# Patient Record
Sex: Male | Born: 1990 | Race: White | Hispanic: No | Marital: Single | Smoking: Current every day smoker
Health system: Southern US, Community
[De-identification: ages and names within clinical notes are randomized; demographics above are authoritative.]

---
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
## 2013-08-28 ENCOUNTER — Encounter: Payer: Self-pay | Admitting: Physician Assistant

## 2013-08-28 ENCOUNTER — Ambulatory Visit (INDEPENDENT_AMBULATORY_CARE_PROVIDER_SITE_OTHER): Payer: PRIVATE HEALTH INSURANCE | Admitting: Physician Assistant

## 2013-08-28 VITALS — BP 138/84 | HR 100 | Temp 98.3°F | Resp 18 | Ht 69.5 in | Wt 202.0 lb

## 2013-08-28 DIAGNOSIS — A499 Bacterial infection, unspecified: Secondary | ICD-10-CM

## 2013-08-28 DIAGNOSIS — B9689 Other specified bacterial agents as the cause of diseases classified elsewhere: Principal | ICD-10-CM

## 2013-08-28 DIAGNOSIS — J988 Other specified respiratory disorders: Secondary | ICD-10-CM

## 2013-08-28 MED ORDER — AZITHROMYCIN 250 MG PO TABS: ORAL_TABLET | ORAL | Status: DC

## 2013-08-28 NOTE — Progress Notes (Signed)
    Patient ID: Jason ParodyDarren Badolato MRN: [id number], DOB: Nov 20, 1990, 23 y.o. Date of Encounter: 08/28/2013, 2:52 PM    Chief Complaint:  Chief Complaint  Patient presents with  . c/o sore throat, left side facial pain     HPI: 23 y.o. year old white male reports that he has had these symptoms for about 5 days now.says that he is blowing out a lot of thick mucus from his nose. Points to  his left maxillary sinus and his left lymph node below his left ear and says that this area is  tender and sore are compared to the right side. Also does have congestion in his chest and does have a cough. No fevers or chills. No significant sore throat- just scratchy.     Home Meds: See attached medication section for any medications that were entered at today's visit. The computer does not put those onto this list.The following list is a list of meds entered prior to today's visit.   No current outpatient prescriptions on file prior to visit.   No current facility-administered medications on file prior to visit.    Allergies:  Allergies  Allergen Reactions  . Penicillins Swelling      Review of Systems: See HPI for pertinent ROS. All other ROS negative.    Physical Exam: Blood pressure 138/84, pulse 100, temperature 98.3 F (36.8 C), temperature source Oral, resp. rate 18, height 5' 9.5" (1.765 m), weight 202 lb (91.627 kg)., Body mass index is 29.41 kg/(m^2). General:  WNWD WM. Appears in no acute distress. HEENT: Normocephalic, atraumatic, eyes without discharge, sclera non-icteric, nares are without discharge. Bilateral auditory canals clear, TM's are without perforation, pearly grey and translucent with reflective cone of light bilaterally. Oral cavity moist, posterior pharynx without exudate, erythema, peritonsillar abscess. Mild Tenderness with percussion of the left maxillary sinus. No tenderness with percussion of the right maxillary or frontal sinuses.  Neck: Supple. No thyromegaly.Left  posterior cervical nodes/postauricular nodes tender but not enlarged. Right neck with no lymphadenopathy. Lungs: Clear bilaterally to auscultation without wheezes, rales, or rhonchi. Breathing is unlabored. Heart: Regular rhythm. No murmurs, rubs, or gallops. Msk:  Strength and tone normal for age. Extremities/Skin: Warm and dry.  No rashes . Neuro: Alert and oriented X 3. Moves all extremities spontaneously. Gait is normal. CNII-XII grossly in tact. Psych:  Responds to questions appropriately with a normal affect.     ASSESSMENT AND PLAN:  23 y.o. year old male with  1. Bacterial respiratory infection PCN Allergy.  Complete  the antibiotic. Can also use over-the-counter decongestants for symptom relief in the meantime. F/U if symptoms do not resolve within one week after completion of antibiotic. - azithromycin (ZITHROMAX) 250 MG tablet; Day 1: Take 2.  Days 2-5: Take 1 daily.  Dispense: 6 tablet; Refill: 0   Signed, [address]Mary Beth Benning[place]ixon, [place], [facility] 08/28/2013 2:52 PM

## 2017-03-11 ENCOUNTER — Encounter (HOSPITAL_COMMUNITY): Payer: Self-pay

## 2017-03-11 ENCOUNTER — Ambulatory Visit (HOSPITAL_COMMUNITY)
Admission: EM | Admit: 2017-03-11 | Discharge: 2017-03-11 | Disposition: A | Discharge status: Home / Self Care | Payer: Self-pay | Attending: Emergency Medicine | Admitting: Emergency Medicine

## 2017-03-11 DIAGNOSIS — L03116 Cellulitis of left lower limb: Secondary | ICD-10-CM

## 2017-03-11 MED ORDER — IBUPROFEN 600 MG PO TABS: 600.0000 mg | ORAL_TABLET | Freq: Four times a day (QID) | ORAL | 0 refills | Status: DC | PRN

## 2017-03-11 MED ORDER — DOXYCYCLINE HYCLATE 100 MG PO CAPS
100.0000 mg | ORAL_CAPSULE | Freq: Two times a day (BID) | ORAL | 0 refills | Status: AC
Start: 2017-03-11 — End: 2017-03-16

## 2017-03-11 NOTE — ED Provider Notes (Signed)
HPI  SUBJECTIVE:  Jason Bennett is a 26 y.o. male who presents with a painful mass of gradually increasing size on his left upper thigh starting 5 days ago.  He also reports redness. He is not sure if he sustained any trauma to the area, states he may have hit it against wood while trying to clear trees. No drainage, no known insect Bite, no fevers, bodyaches. No contacts with MRSA. Alleviating factors. He has not tried anything for this. Symptoms are worse with palpation. Past medical history negative for HIV, diabetes, hypertension, MRSA. PMD: None.   History reviewed. No pertinent past medical history.  History reviewed. No pertinent surgical history.  History reviewed. No pertinent family history.  Social History  Substance Use Topics  . Smoking status: Current Every Day Smoker    Packs/day: 0.30    Types: Cigarettes  . Smokeless tobacco: Never Used  . Alcohol use No    No current facility-administered medications for this encounter.   Current Outpatient Prescriptions:  .  doxycycline (VIBRAMYCIN) 100 MG capsule, Take 1 capsule (100 mg total) by mouth 2 (two) times daily., Disp: 10 capsule, Rfl: 0 .  ibuprofen (ADVIL,MOTRIN) 600 MG tablet, Take 1 tablet (600 mg total) by mouth every 6 (six) hours as needed., Disp: 30 tablet, Rfl: 0  Allergies  Allergen Reactions  . Penicillins Swelling     ROS  As noted in HPI.   Physical Exam  BP (!) 155/86 (BP Location: Left Arm)   Pulse (!) 105   Temp 98.8 F (37.1 C) (Oral)   Resp 17   SpO2 100%   Constitutional: Well developed, well nourished, no acute distress Eyes:  EOMI, conjunctiva normal bilaterally HENT: Normocephalic, atraumatic,mucus membranes moist Respiratory: Normal inspiratory effort Cardiovascular: Normal rate GI: nondistended skin: 12.5 x 7.5 cm nontender area of blanchable erythema, increased temperature along the left medial upper thigh. Marked this with a solid line for reference. 3 x 3 cm tender round  mobile mass at the superior part of the erythema. No central fluctuance. Marked this with a dotted line for reference. See picture.    Lymph: Positive inguinal lymphadenopathy. Musculoskeletal: no deformities Neurologic: Alert & oriented x 3, no focal neuro deficits Psychiatric: Speech and behavior appropriate   ED Course   Medications - No data to display  No orders of the defined types were placed in this encounter.   No results found for this or any previous visit (from the past 24 hour(s)). No results found.  ED Clinical Impression  Cellulitis of left lower extremity  ED Assessment/Plan  Presentation most consistent with a cellulitis. Feel that the mass is most likely a lymph node. There does not appear to be anything to I&D at this time. Home with warm compresses, doxycycline, ibuprofen 600 mg take 1 g of Tylenol. Providing primary care referral list for him to follow up with as needed. Or may return here. To the ER if he gets worse..  Discussed labs, imaging, MDM, plan and followup with patient. Discussed sn/sx that should prompt return to the ED. Patient  agrees with plan.   Meds ordered this encounter  Medications  . doxycycline (VIBRAMYCIN) 100 MG capsule    Sig: Take 1 capsule (100 mg total) by mouth 2 (two) times daily.    Dispense:  10 capsule    Refill:  0  . ibuprofen (ADVIL,MOTRIN) 600 MG tablet    Sig: Take 1 tablet (600 mg total) by mouth every 6 (six) hours as needed.  Dispense:  30 tablet    Refill:  0    *This clinic note was created using Scientist, clinical (histocompatibility and immunogenetics)Dragon dictation software. Therefore, there may be occasional mistakes despite careful proofreading.  ?   Domenick GongMortenson, Sascha Palma, MD 03/11/17 2054

## 2017-03-11 NOTE — ED Triage Notes (Signed)
[facility] knot on upper thigh since Saturday 03/06/2017, pt denies any injuries but says the knot has got bigger since Saturday.

## 2017-03-11 NOTE — Discharge Instructions (Signed)
Take the medication as written. Take 1 gram of tylenol with the motrin up to 3- 4 times a day as needed for pain and fever. This is an effective combination for pain. Return to the ER if you get worse, have a persistent fever >100.4, for the signs and symptoms we discussed, or for any concerns.   Below is a list of primary care practices who are taking new patients for you to follow-up with. [facility] [address] [place], [place] [address] [phone]  [facility] [phone] [address] [place] [address]  [facility]: [address] [place] [id number]  [phone]  [facility]: [address] [place] [id number]   [phone]  [facility]: [address] [place] [address]  [phone]  [facility] : [address] [place] [id number] [phone]  [facility]: [address] [place] [phone]  [facility]: [address] [place] [id number] [phone]  Dr. Oneal GroutMahima Pandey [address] [facility] [place] [id number]  [phone]  Dr. Jackie PlumGeorge Osei-Bonsu, [facility]. [address] [place], [place] [address]  [phone]  Go to www.goodrx.com to look up your medications. This will give you a list of where you can find your prescriptions at the most affordable prices. Or ask the pharmacist what the cash price is, or if they have any other discount programs available to help make your medication more affordable. This can be less expensive than what you would pay with insurance.

## 2017-08-13 ENCOUNTER — Encounter (HOSPITAL_COMMUNITY): Payer: Self-pay | Admitting: Emergency Medicine

## 2017-08-13 ENCOUNTER — Emergency Department (HOSPITAL_COMMUNITY): Payer: Self-pay

## 2017-08-13 ENCOUNTER — Emergency Department (HOSPITAL_COMMUNITY)
Admission: EM | Admit: 2017-08-13 | Discharge: 2017-08-14 | Disposition: A | Discharge status: Home / Self Care | Payer: Self-pay | Attending: Emergency Medicine | Admitting: Emergency Medicine

## 2017-08-13 DIAGNOSIS — W230XXA Caught, crushed, jammed, or pinched between moving objects, initial encounter: Secondary | ICD-10-CM | POA: Insufficient documentation

## 2017-08-13 DIAGNOSIS — S61215A Laceration without foreign body of left ring finger without damage to nail, initial encounter: Secondary | ICD-10-CM | POA: Insufficient documentation

## 2017-08-13 DIAGNOSIS — Y929 Unspecified place or not applicable: Secondary | ICD-10-CM | POA: Insufficient documentation

## 2017-08-13 DIAGNOSIS — Y939 Activity, unspecified: Secondary | ICD-10-CM | POA: Insufficient documentation

## 2017-08-13 DIAGNOSIS — Z23 Encounter for immunization: Secondary | ICD-10-CM | POA: Insufficient documentation

## 2017-08-13 DIAGNOSIS — F17210 Nicotine dependence, cigarettes, uncomplicated: Secondary | ICD-10-CM | POA: Insufficient documentation

## 2017-08-13 DIAGNOSIS — Y999 Unspecified external cause status: Secondary | ICD-10-CM | POA: Insufficient documentation

## 2017-08-13 DIAGNOSIS — S61217A Laceration without foreign body of left little finger without damage to nail, initial encounter: Secondary | ICD-10-CM | POA: Insufficient documentation

## 2017-08-13 DIAGNOSIS — Z79899 Other long term (current) drug therapy: Secondary | ICD-10-CM | POA: Insufficient documentation

## 2017-08-13 MED ORDER — TETANUS-DIPHTH-ACELL PERTUSSIS 5-2.5-18.5 LF-MCG/0.5 IM SUSP
0.5000 mL | Freq: Once | INTRAMUSCULAR | Status: AC
  Administered 2017-08-13: 0.5 mL via INTRAMUSCULAR
  Filled 2017-08-13: qty 0.5

## 2017-08-13 MED ORDER — BUPIVACAINE HCL (PF) 0.5 % IJ SOLN
10.0000 mL | Freq: Once | INTRAMUSCULAR | Status: AC
  Administered 2017-08-13: 10 mL
  Filled 2017-08-13: qty 10

## 2017-08-13 NOTE — ED Triage Notes (Signed)
ETOH on board 

## 2017-08-13 NOTE — ED Triage Notes (Signed)
Pt reports he slammed his L hand in trunk of car. Pt has laceration to L ring finger and L pinky finger. Unsure of tetanus status.

## 2017-08-13 NOTE — ED Provider Notes (Signed)
[facility] Provider Note   CSN: [id number] Arrival date & time: 08/13/17  2120     History   Chief Complaint Chief Complaint  Patient presents with  . Laceration    HPI Jason Bennett is a 27 y.o. male.  Patient present with injury to left hand, 4th and 5th digits, after closed a car trunk on his hand earlier today. No other injury.   The history is provided by the patient. No language interpreter was used.  Laceration      History reviewed. No pertinent past medical history.  There are no active problems to display for this patient.   History reviewed. No pertinent surgical history.      Home Medications    Prior to Admission medications   Medication Sig Start Date End Date Taking? Authorizing Provider  ibuprofen (ADVIL,MOTRIN) 600 MG tablet Take 1 tablet (600 mg total) by mouth every 6 (six) hours as needed. 03/11/17   Domenick GongMortenson, Ashley, MD    Family History No family history on file.  Social History Social History   Tobacco Use  . Smoking status: Current Every Day Smoker    Packs/day: 0.30    Types: Cigarettes  . Smokeless tobacco: Never Used  Substance Use Topics  . Alcohol use: No  . Drug use: No     Allergies   Penicillins   Review of Systems Review of Systems  Gastrointestinal: Negative for nausea.  Skin: Positive for wound.  Neurological: Negative for numbness.     Physical Exam Updated Vital Signs BP 138/82   Pulse (!) 103   Temp 98.2 F (36.8 C)   Resp (!) 22   SpO2 97%   Physical Exam  Constitutional: He is oriented to person, place, and time. He appears well-developed and well-nourished.  Neck: Normal range of motion.  Pulmonary/Chest: Effort normal.  Musculoskeletal: Normal range of motion.  Neurological: He is alert and oriented to person, place, and time. No sensory deficit.  Skin: Skin is warm and dry.  2 cm laceration palmar 4th left finger overlying PIP. Actively  bleeding. ROM preserved. Crescent shaped laceration lateral base 5th finger, flap laceration. No significant swelling or active bleeding. ROM preserved.   Psychiatric: He has a normal mood and affect.     ED Treatments / Results  Labs (all labs ordered are listed, but only abnormal results are displayed) Labs Reviewed - No data to display  EKG None  Radiology Dg Hand Complete Left  Result Date: 08/13/2017 CLINICAL DATA:  Left hand laceration EXAM: LEFT HAND - COMPLETE 3+ VIEW COMPARISON:  None. FINDINGS: There is no evidence of fracture or dislocation. There is no evidence of arthropathy or other focal bone abnormality. Soft tissues are unremarkable. IMPRESSION: Negative. Electronically Signed   By: Jasmine PangKim  Fujinaga M.D.   On: 08/13/2017 22:00    Procedures .Marland Kitchen.Laceration Repair Date/Time: 08/14/2017 12:11 AM Performed by: Elpidio AnisUpstill, Kenedi Cilia, PA-C Authorized by: Elpidio AnisUpstill, Gerrell Tabet, PA-C   Consent:    Consent obtained:  Verbal   Consent given by:  Patient Anesthesia (see MAR for exact dosages):    Anesthesia method:  Nerve block   Block location:  Palmar 4th MCP   Block needle gauge:  27 G   Block anesthetic:  Bupivacaine 0.5% w/o epi Laceration details:    Location:  Finger   Finger location:  L ring finger   Length (cm):  2 Repair type:    Repair type:  Simple Pre-procedure details:    Preparation:  Patient was prepped and draped in usual sterile fashion Exploration:    Hemostasis achieved with:  Tourniquet   Wound exploration: wound explored through full range of motion and entire depth of wound probed and visualized     Wound extent: no foreign bodies/material noted and no tendon damage noted     Contaminated: no   Treatment:    Area cleansed with:  Betadine and saline   Amount of cleaning:  Standard   Irrigation solution:  Sterile water   Irrigation method:  Pressure wash Skin repair:    Repair method:  Sutures   Suture size:  4-0   Suture material:  Prolene   Suture  technique:  Simple interrupted   Number of sutures:  6 Approximation:    Approximation:  Close Post-procedure details:    Dressing:  Antibiotic ointment and non-adherent dressing   Patient tolerance of procedure:  Tolerated well, no immediate complications .Marland KitchenLaceration Repair Date/Time: 08/14/2017 12:14 AM Performed by: Elpidio Anis, PA-C Authorized by: Elpidio Anis, PA-C   Consent:    Consent obtained:  Verbal   Consent given by:  Patient Anesthesia (see MAR for exact dosages):    Anesthesia method:  Local infiltration   Local anesthetic:  Bupivacaine 0.5% w/o epi Laceration details:    Location:  Finger   Finger location:  L small finger   Length (cm):  1 Repair type:    Repair type:  Simple Pre-procedure details:    Preparation:  Patient was prepped and draped in usual sterile fashion Exploration:    Hemostasis achieved with:  Direct pressure   Wound exploration: wound explored through full range of motion     Contaminated: no   Treatment:    Area cleansed with:  Betadine and saline   Amount of cleaning:  Standard Skin repair:    Repair method:  Sutures   Suture size:  4-0   Suture material:  Prolene   Suture technique:  Simple interrupted   Number of sutures:  3 Approximation:    Approximation:  Close Post-procedure details:    Dressing:  Antibiotic ointment and non-adherent dressing   Patient tolerance of procedure:  Tolerated well, no immediate complications   (including critical care time)  Medications Ordered in ED Medications  Tdap (BOOSTRIX) injection 0.5 mL (has no administration in time range)  bupivacaine (MARCAINE) 0.5 % injection 10 mL (has no administration in time range)     Initial Impression / Assessment and Plan / ED Course  I have reviewed the triage vital signs and the nursing notes.  Pertinent labs & imaging results that were available during my care of the patient were reviewed by me and considered in my medical decision making (see  chart for details).     Patient presents after closing left fingers in truck of car causing multiple lacerations, closed as per procedure notes. Imaging negative for fracture.   Tetanus updated here. Wound care discussed. Sutures out in 7-10 day.   Final Clinical Impressions(s) / ED Diagnoses   Final diagnoses:  None   1. Contusion injury, finger 2. Laceration 4th left finger 3. Laceration 5th left finger.   ED Discharge Orders    None       Elpidio Anis, Cordelia Poche 08/14/17 0016    Charlynne Pander, MD 08/17/17 1044

## 2017-08-14 MED ORDER — IBUPROFEN 600 MG PO TABS: 600.0000 mg | ORAL_TABLET | Freq: Four times a day (QID) | ORAL | 0 refills | Status: DC | PRN

## 2017-08-14 NOTE — ED Notes (Signed)
Pt discharged from ED; instructions provided and scripts given; Pt encouraged to return to ED if symptoms worsen and to f/u with PCP; Pt verbalized understanding of all instructions 

## 2017-10-22 ENCOUNTER — Ambulatory Visit (HOSPITAL_COMMUNITY)
Admission: EM | Admit: 2017-10-22 | Discharge: 2017-10-22 | Disposition: A | Discharge status: Home / Self Care | Payer: Self-pay | Attending: Urgent Care | Admitting: Urgent Care

## 2017-10-22 ENCOUNTER — Encounter (HOSPITAL_COMMUNITY): Payer: Self-pay | Admitting: Emergency Medicine

## 2017-10-22 DIAGNOSIS — L500 Allergic urticaria: Secondary | ICD-10-CM

## 2017-10-22 DIAGNOSIS — R21 Rash and other nonspecific skin eruption: Secondary | ICD-10-CM

## 2017-10-22 MED ORDER — METHYLPREDNISOLONE ACETATE 80 MG/ML IJ SUSP
80.0000 mg | Freq: Once | INTRAMUSCULAR | Status: AC
  Administered 2017-10-22: 80 mg via INTRAMUSCULAR

## 2017-10-22 MED ORDER — HYDROXYZINE HCL 25 MG PO TABS: 25.0000 mg | ORAL_TABLET | Freq: Three times a day (TID) | ORAL | 0 refills | Status: AC | PRN

## 2017-10-22 MED ORDER — METHYLPREDNISOLONE ACETATE 80 MG/ML IJ SUSP
INTRAMUSCULAR | Status: AC
  Filled 2017-10-22: qty 1

## 2017-10-22 NOTE — ED Provider Notes (Signed)
  MRN: [id number] DOB: [birth year]  Subjective:   Jason Bennett is a 27 y.o. male presenting for acute onset ~2 hours ago of urticaria over his arms that has since spread to his torso, back, and thighs. Patient was at [facility] when he started feeling itching. He took 2 doses of benadryl. He is not currently taking any medications.  Denies facial swelling, chest tightness, shob, wheezing, n/v, abdominal pain. Has kept the same routine, no new medications or new foods. Had a tick bite on his back more than 2 weeks ago.   Allergies  Allergen Reactions  . Penicillins Swelling   Denies past medical and surgical history.   Objective:   Vitals: BP (!) 144/85   Pulse 85   Temp 98 F (36.7 C)   Resp 18   SpO2 96%   Physical Exam  Constitutional: He is oriented to person, place, and time. He appears well-developed and well-nourished.  HENT:  Mouth/Throat: Oropharynx is clear and moist.  Eyes: No scleral icterus.  Cardiovascular: Normal rate, regular rhythm and intact distal pulses. Exam reveals no gallop and no friction rub.  No murmur heard. Pulmonary/Chest: No respiratory distress. He has no wheezes. He has no rales.  Neurological: He is alert and oriented to person, place, and time.  Skin: Skin is warm and dry. Rash (large urticarial patches diffusely scattered across his arms, torso, back and thighs) noted.   Assessment and Plan :   Rash and nonspecific skin eruption  Allergic urticaria  IM depomedrol in clinic. Use Vistaril at home. ER and rtc precautions reviewed.   Wallis Bamberg, PA-C 10/22/17 2004

## 2017-10-22 NOTE — ED Triage Notes (Signed)
Pt broke out in rash all over body 1.5 hour PTA. Denies issues breathing.

## 2019-01-09 ENCOUNTER — Other Ambulatory Visit: Payer: Self-pay

## 2019-01-09 ENCOUNTER — Encounter (HOSPITAL_COMMUNITY): Payer: Self-pay

## 2019-01-09 ENCOUNTER — Emergency Department (HOSPITAL_COMMUNITY)
Admission: EM | Admit: 2019-01-09 | Discharge: 2019-01-09 | Disposition: A | Discharge status: Home / Self Care | Payer: Self-pay | Attending: Emergency Medicine | Admitting: Emergency Medicine

## 2019-01-09 DIAGNOSIS — Z79899 Other long term (current) drug therapy: Secondary | ICD-10-CM | POA: Insufficient documentation

## 2019-01-09 DIAGNOSIS — T781XXA Other adverse food reactions, not elsewhere classified, initial encounter: Secondary | ICD-10-CM | POA: Insufficient documentation

## 2019-01-09 DIAGNOSIS — F17210 Nicotine dependence, cigarettes, uncomplicated: Secondary | ICD-10-CM | POA: Insufficient documentation

## 2019-01-09 MED ORDER — PREDNISONE 20 MG PO TABS: 40.0000 mg | ORAL_TABLET | Freq: Every day | ORAL | 0 refills | Status: AC

## 2019-01-09 MED ORDER — FAMOTIDINE IN NACL 20-0.9 MG/50ML-% IV SOLN
20.0000 mg | INTRAVENOUS | Status: AC
  Administered 2019-01-09: 20 mg via INTRAVENOUS
  Filled 2019-01-09: qty 50

## 2019-01-09 MED ORDER — METHYLPREDNISOLONE SODIUM SUCC 125 MG IJ SOLR
125.0000 mg | Freq: Once | INTRAMUSCULAR | Status: AC
  Administered 2019-01-09: 125 mg via INTRAVENOUS
  Filled 2019-01-09: qty 2

## 2019-01-09 MED ORDER — DIPHENHYDRAMINE HCL 50 MG/ML IJ SOLN
25.0000 mg | Freq: Once | INTRAMUSCULAR | Status: AC
  Administered 2019-01-09: 25 mg via INTRAVENOUS
  Filled 2019-01-09: qty 1

## 2019-01-09 NOTE — ED Triage Notes (Signed)
Pt coming from home c/o allergic rxn to possibly red meat. Pt has red raised rash on body. Pt had lyme disease last year with similar symptoms when eating red meat afterwards. Pt took benadryl and "some type of itching pill" at home. No shortness of breath

## 2019-01-09 NOTE — ED Provider Notes (Signed)
[facility] DEPT Provider Note   CSN: [id number] Arrival date & time: 01/09/19  8185     History   Chief Complaint Chief Complaint  Patient presents with  . Allergic Reaction    HPI Jason Bennett is a 28 y.o. male.     Patient presents to the emergency department with a chief complaint of allergic reaction.  He states that he has an allergy to red meat.  States that he ate some tonight.  Caused him to break out in a rash.  He reports having an episode of diarrhea.  Denies any nausea or vomiting.  Denies any shortness of breath, cough, or wheezing.  Denies any throat swelling sensation.  Tried taking some Benadryl at home with little relief.  Has a history of the same.  The history is provided by the patient. No language interpreter was used.    History reviewed. No pertinent past medical history.  There are no active problems to display for this patient.   History reviewed. No pertinent surgical history.      Home Medications    Prior to Admission medications   Medication Sig Start Date End Date Taking? Authorizing Provider  hydrOXYzine (ATARAX/VISTARIL) 25 MG tablet Take 1 tablet (25 mg total) by mouth every 8 (eight) hours as needed for itching. 10/22/17   Jaynee Eagles, PA-C  ibuprofen (ADVIL,MOTRIN) 600 MG tablet Take 1 tablet (600 mg total) by mouth every 6 (six) hours as needed. 08/14/17   Charlann Lange, PA-C    Family History No family history on file.  Social History Social History   Tobacco Use  . Smoking status: Current Every Day Smoker    Packs/day: 0.30    Types: Cigarettes  . Smokeless tobacco: Never Used  Substance Use Topics  . Alcohol use: No  . Drug use: No     Allergies   Penicillins   Review of Systems Review of Systems  All other systems reviewed and are negative.    Physical Exam Updated Vital Signs BP (!) 147/115 (BP Location: Left Arm)   Pulse (!) 126   Temp 98.4 F (36.9 C) (Oral)   Resp  16   Ht 5\' 10"  (1.778 m)   Wt 119.1 kg   SpO2 100%   BMI 37.66 kg/m   Physical Exam Vitals signs and nursing note reviewed.  Constitutional:      Appearance: He is well-developed.  HENT:     Head: Normocephalic and atraumatic.  Eyes:     Conjunctiva/sclera: Conjunctivae normal.  Neck:     Musculoskeletal: Neck supple.  Cardiovascular:     Rate and Rhythm: Normal rate and regular rhythm.     Heart sounds: No murmur.  Pulmonary:     Effort: Pulmonary effort is normal. No respiratory distress.     Breath sounds: Normal breath sounds.  Abdominal:     Palpations: Abdomen is soft.     Tenderness: There is no abdominal tenderness.  Skin:    General: Skin is warm and dry.     Comments: Diffuse urticaria  Neurological:     Mental Status: He is alert and oriented to person, place, and time.  Psychiatric:        Mood and Affect: Mood normal.        Behavior: Behavior normal.      ED Treatments / Results  Labs (all labs ordered are listed, but only abnormal results are displayed) Labs Reviewed - No data to display  EKG None  Radiology No results found.  Procedures Procedures (including critical care time)  Medications Ordered in ED Medications  famotidine (PEPCID) IVPB 20 mg premix (20 mg Intravenous New Bag/Given 01/09/19 0413)  methylPREDNISolone sodium succinate (SOLU-MEDROL) 125 mg/2 mL injection 125 mg (125 mg Intravenous Given 01/09/19 0411)  diphenhydrAMINE (BENADRYL) injection 25 mg (25 mg Intravenous Given 01/09/19 0412)     Initial Impression / Assessment and Plan / ED Course  I have reviewed the triage vital signs and the nursing notes.  Pertinent labs & imaging results that were available during my care of the patient were reviewed by me and considered in my medical decision making (see chart for details).        Patient with allergic reaction to red meat.  He has diffuse urticaria.  No wheezing.  No throat swelling.  Did have one episode of diarrhea.   Will give Solu-Medrol, Pepcid, and Benadryl.  Will reassess.  5:54 AM Patient reassessed.  Feels much better.  Hives significantly improved.  Final Clinical Impressions(s) / ED Diagnoses   Final diagnoses:  Allergic reaction to food, initial encounter    ED Discharge Orders         Ordered    predniSONE (DELTASONE) 20 MG tablet  Daily     01/09/19 0553           Roxy HorsemanBrowning, Arlo Buffone, PA-C 01/09/19 [id number]    Devoria AlbeKnapp, Iva, MD 01/09/19 [phone] [id number]

## 2019-01-16 IMAGING — DX DG HAND COMPLETE 3+V*L*
3 series · 3 of 3 positions shown · non-contrast
Comparison: None.

CLINICAL DATA: Left hand laceration

EXAM:
LEFT HAND - COMPLETE 3+ VIEW

[x hand pa left]
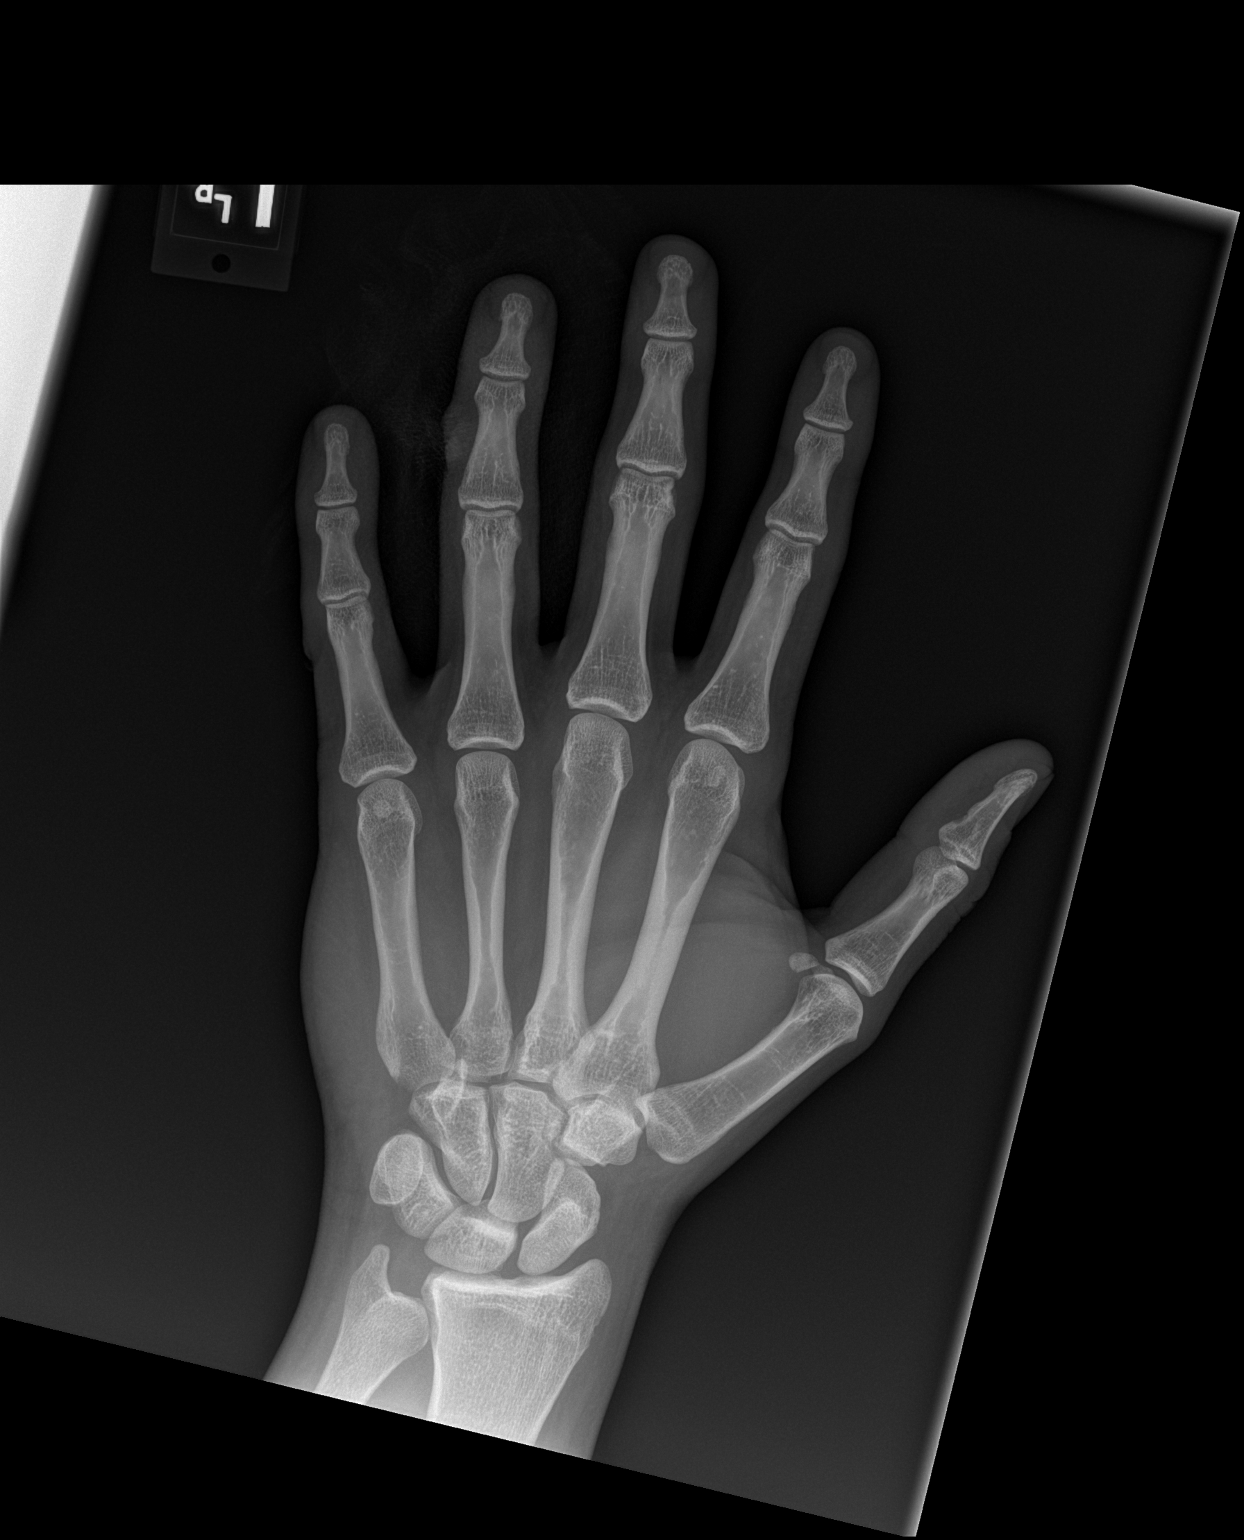

[x hand obl left]
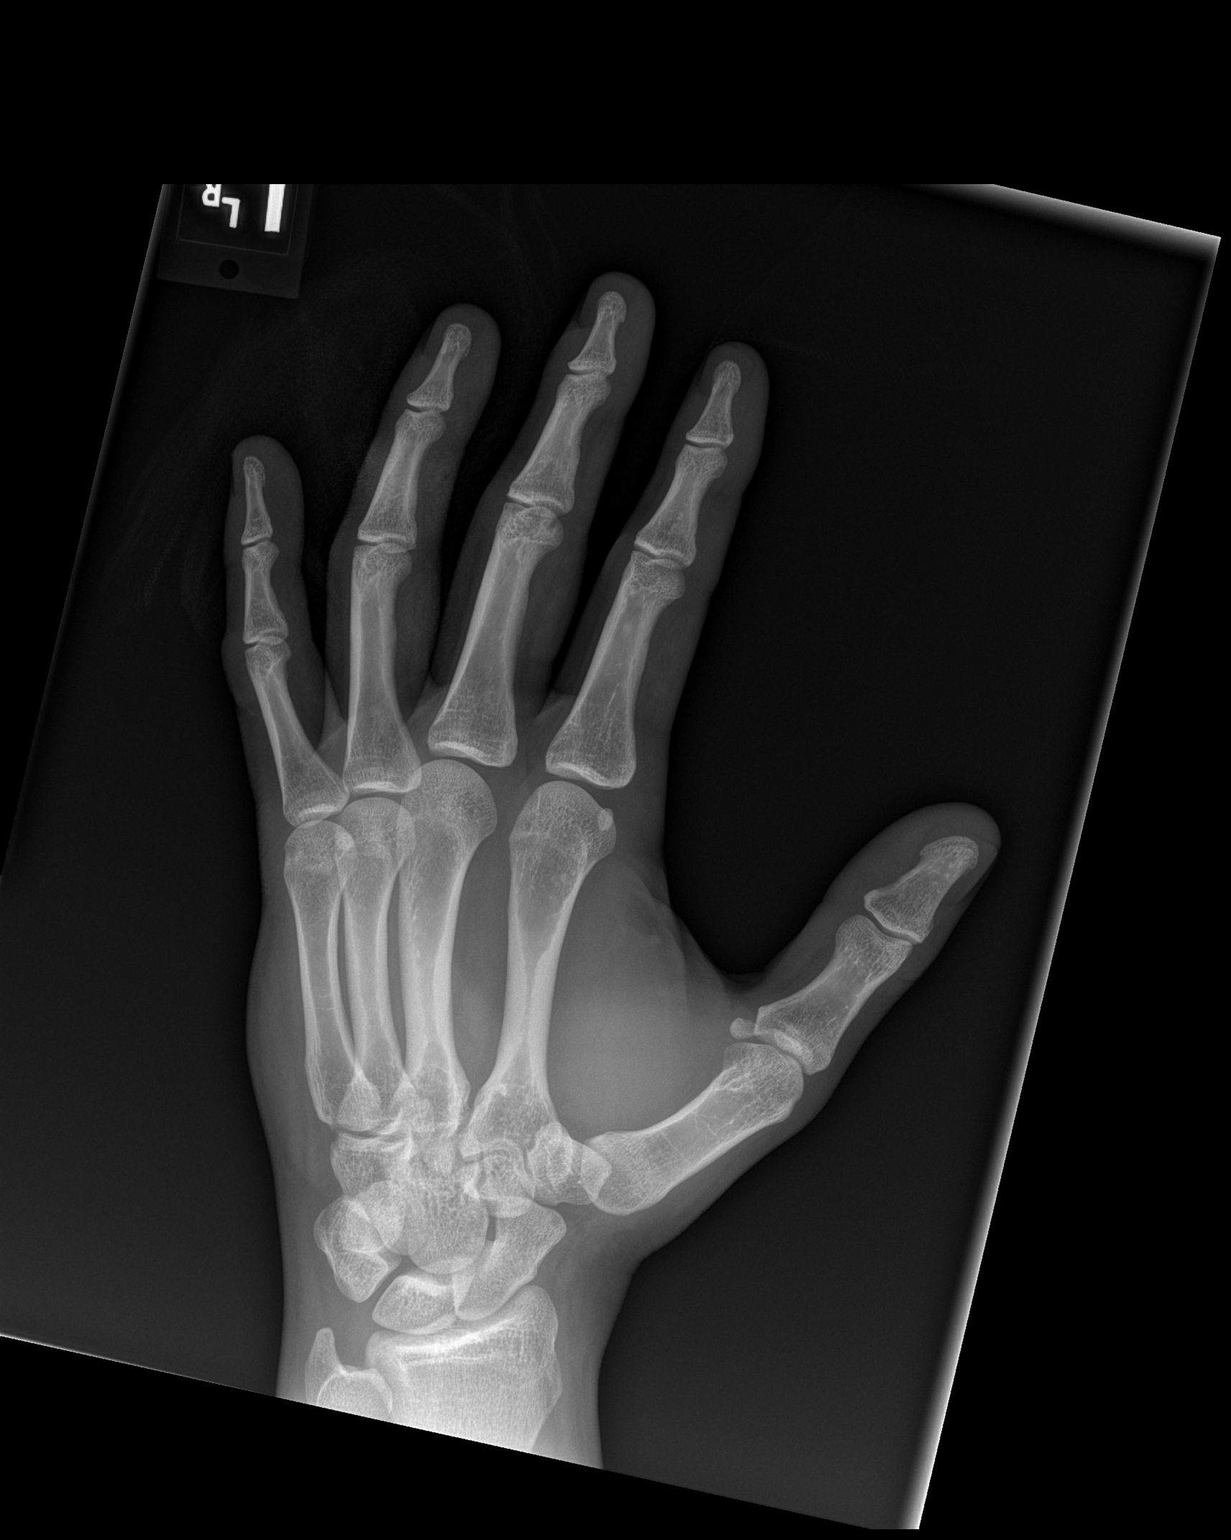

[x hand lat left]
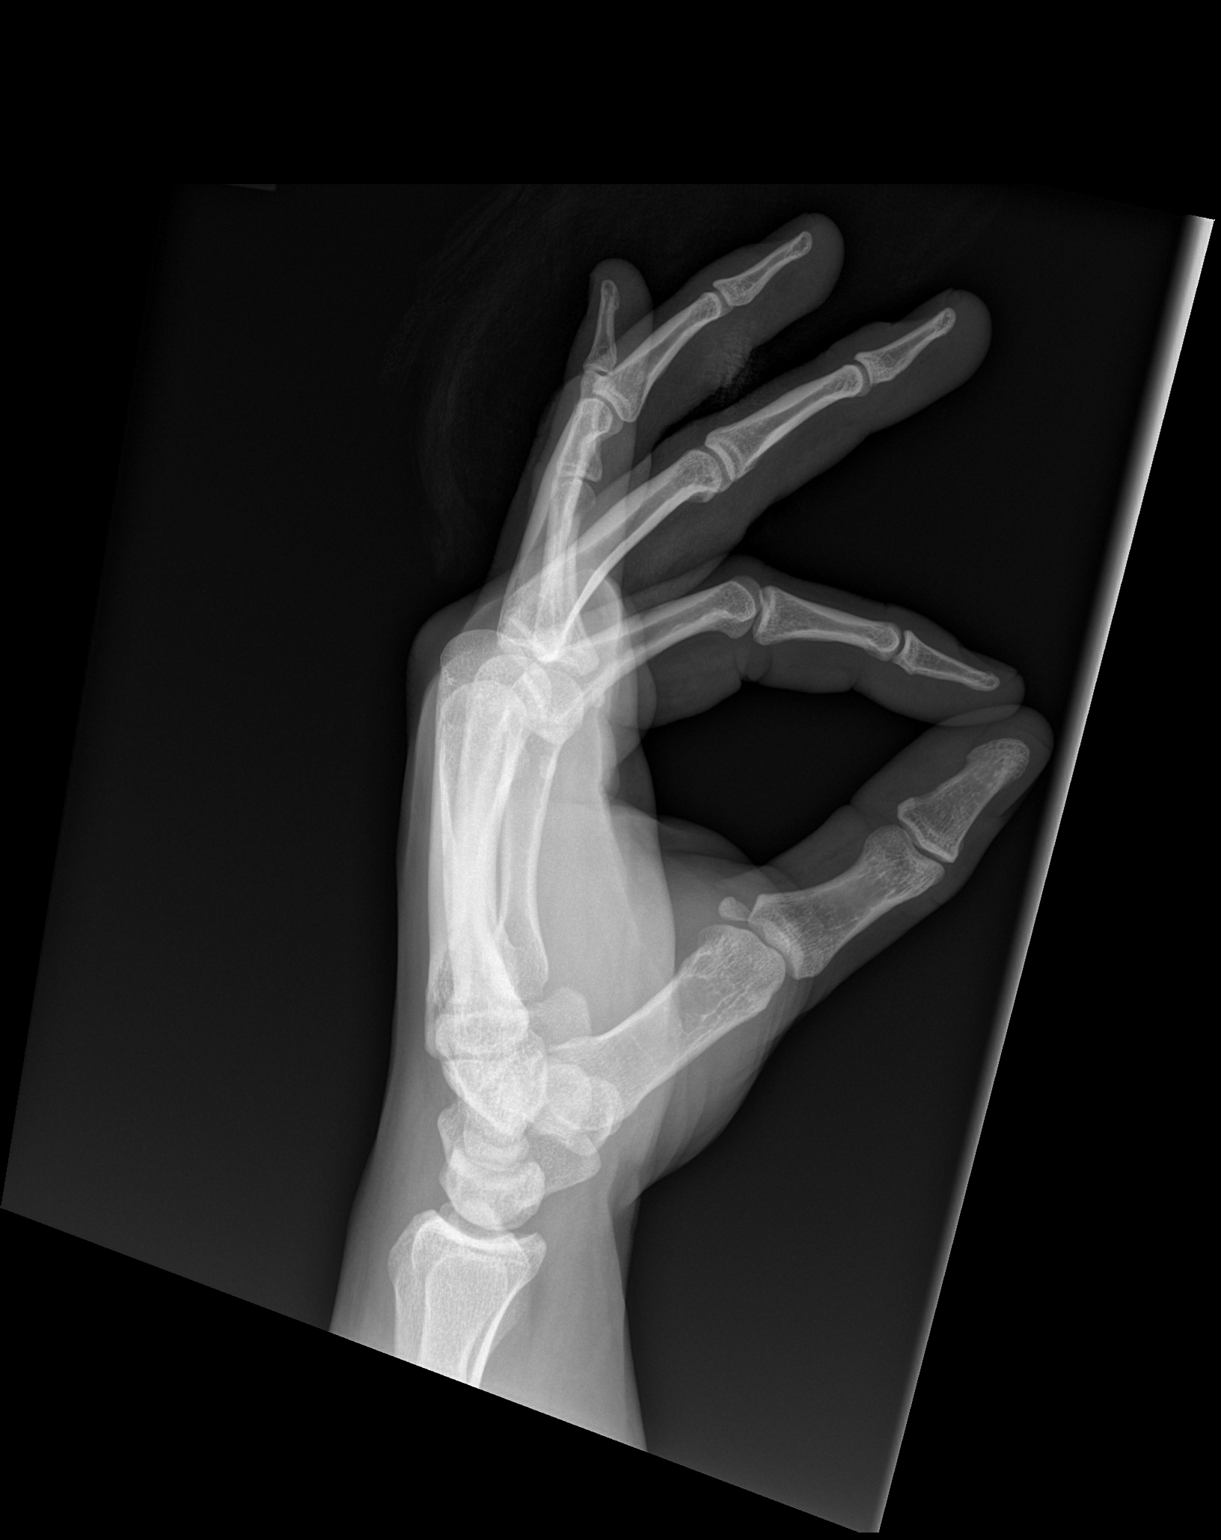

[3 of 3 positions shown; findings below may reference images not displayed]

FINDINGS: There is no evidence of fracture or dislocation. There is no
evidence of arthropathy or other focal bone abnormality. Soft
tissues are unremarkable.
IMPRESSION: Negative.

## 2019-04-12 ENCOUNTER — Other Ambulatory Visit: Payer: Self-pay

## 2019-04-12 DIAGNOSIS — Z20822 Contact with and (suspected) exposure to covid-19: Secondary | ICD-10-CM

## 2019-04-14 LAB — NOVEL CORONAVIRUS, NAA: SARS-CoV-2, NAA: NOT DETECTED

## 2020-06-29 ENCOUNTER — Encounter: Payer: Self-pay | Admitting: Emergency Medicine

## 2020-06-29 ENCOUNTER — Other Ambulatory Visit: Payer: Self-pay

## 2020-06-29 ENCOUNTER — Ambulatory Visit
Admission: EM | Admit: 2020-06-29 | Discharge: 2020-06-29 | Disposition: A | Discharge status: Home / Self Care | Payer: 59 | Attending: Family Medicine | Admitting: Family Medicine

## 2020-06-29 DIAGNOSIS — T148XXA Other injury of unspecified body region, initial encounter: Secondary | ICD-10-CM | POA: Diagnosis not present

## 2020-06-29 DIAGNOSIS — R112 Nausea with vomiting, unspecified: Secondary | ICD-10-CM | POA: Diagnosis not present

## 2020-06-29 DIAGNOSIS — M545 Low back pain, unspecified: Secondary | ICD-10-CM | POA: Diagnosis not present

## 2020-06-29 DIAGNOSIS — K529 Noninfective gastroenteritis and colitis, unspecified: Secondary | ICD-10-CM | POA: Diagnosis not present

## 2020-06-29 MED ORDER — CYCLOBENZAPRINE HCL 10 MG PO TABS: 10.0000 mg | ORAL_TABLET | Freq: Two times a day (BID) | ORAL | 0 refills | Status: AC | PRN

## 2020-06-29 MED ORDER — IBUPROFEN 800 MG PO TABS: 800.0000 mg | ORAL_TABLET | Freq: Three times a day (TID) | ORAL | 0 refills | Status: AC | PRN

## 2020-06-29 MED ORDER — ONDANSETRON HCL 4 MG PO TABS: 4.0000 mg | ORAL_TABLET | Freq: Four times a day (QID) | ORAL | 0 refills | Status: AC

## 2020-06-29 NOTE — ED Triage Notes (Signed)
Vomiting last night, today has lower back pain.  No known injury

## 2020-06-29 NOTE — Discharge Instructions (Addendum)
Take ibuprofen as needed for your pain.    Take the muscle relaxer Flexeril as needed for muscle spasm; Do not drive, operate machinery, or drink alcohol with this medication as it may make you drowsy.    I have sent in Zofran for you to take one tablet every 8 hours as needed for nausea.  Follow up with your primary care provider or an orthopedist if your pain is not improving.

## 2020-06-29 NOTE — ED Provider Notes (Signed)
[facility]   [id number] 06/29/20 Arrival Time: 7902  IO:[id number] PAIN  SUBJECTIVE: History from: patient. Jason Bennett is a 30 y.o. male complains of left lower back pain that began last night.  Reports that he has been vomiting for the last 24 hours as well.  Reports that he thinks that he ate some bad chicken.  Reports that he put a water bottle under his back, then he coughed and feels like he threw his back out.  Has not attempted OTC treatment at home for the vomiting or for back pain.  Describes the pain as intermittent and aching character.  Is worse with activity.  Denies fever, chills, erythema, ecchymosis, effusion, weakness, numbness and tingling, saddle paresthesias, loss of bowel or bladder function.      ROS: As per HPI.  All other pertinent ROS negative.     History reviewed. No pertinent past medical history. History reviewed. No pertinent surgical history. Allergies  Allergen Reactions  . Penicillins Swelling    Did it involve swelling of the face/tongue/throat, SOB, or low BP? Yes Did it involve sudden or severe rash/hives, skin peeling, or any reaction on the inside of your mouth or nose? No Did you need to seek medical attention at a hospital or doctor's office? No When did it last happen?childhood If all above answers are "NO", may proceed with cephalosporin use.    No current facility-administered medications on file prior to encounter.   Current Outpatient Medications on File Prior to Encounter  Medication Sig Dispense Refill  . diphenhydrAMINE (BENADRYL) 25 MG tablet Take 25 mg by mouth every 6 (six) hours as needed for itching or allergies.    . hydrOXYzine (ATARAX/VISTARIL) 25 MG tablet Take 1 tablet (25 mg total) by mouth every 8 (eight) hours as needed for itching. 30 tablet 0  . predniSONE (DELTASONE) 20 MG tablet Take 2 tablets (40 mg total) by mouth daily. 10 tablet 0   Social History   Socioeconomic History  . Marital status: Single     Spouse name: Not on file  . Number of children: Not on file  . Years of education: Not on file  . Highest education level: Not on file  Occupational History  . Not on file  Tobacco Use  . Smoking status: Current Every Day Smoker    Packs/day: 0.30    Types: Cigarettes  . Smokeless tobacco: Never Used  Substance and Sexual Activity  . Alcohol use: No  . Drug use: No  . Sexual activity: Not on file  Other Topics Concern  . Not on file  Social History Narrative  . Not on file   Social Determinants of Health   Financial Resource Strain: Not on file  Food Insecurity: Not on file  Transportation Needs: Not on file  Physical Activity: Not on file  Stress: Not on file  Social Connections: Not on file  Intimate Partner Violence: Not on file   History reviewed. No pertinent family history.  OBJECTIVE:  Vitals:   06/29/20 0847 06/29/20 0850  BP: (!) 169/108   Pulse: (!) 111   Resp: 16   Temp: 98.4 F (36.9 C)   TempSrc: Tympanic   SpO2: 97%   Weight:  240 lb (108.9 kg)  Height:  6' (1.829 m)    General appearance: ALERT; in no acute distress.  Head: NCAT Lungs: Normal respiratory effort CV: pulses 2+ bilaterally. Cap refill < 2 seconds Musculoskeletal:  Inspection: Skin warm, dry, clear and intact No erythema,  effusion to to left low back Palpation: Left low back tender to palpation ROM: Limited ROM active and passive to the back with bending, twisting, changing positions Skin: warm and dry Neurologic: Ambulates without difficulty; Sensation intact about the upper/ lower extremities Psychological: alert and cooperative; normal mood and affect  DIAGNOSTIC STUDIES:  No results found.   ASSESSMENT & PLAN:  1. Nausea and vomiting, intractability of vomiting not specified, unspecified vomiting type   2. Muscle strain   3. Acute left-sided low back pain without sciatica   4. Gastroenteritis      Meds ordered this encounter  Medications  . ibuprofen (ADVIL)  800 MG tablet    Sig: Take 1 tablet (800 mg total) by mouth every 8 (eight) hours as needed for moderate pain.    Dispense:  21 tablet    Refill:  0    Order Specific Question:   Supervising Provider    Answer:   Merrilee Jansky [id number]  . cyclobenzaprine (FLEXERIL) 10 MG tablet    Sig: Take 1 tablet (10 mg total) by mouth 2 (two) times daily as needed for muscle spasms.    Dispense:  20 tablet    Refill:  0    Order Specific Question:   Supervising Provider    Answer:   Merrilee Jansky [id number]  . ondansetron (ZOFRAN) 4 MG tablet    Sig: Take 1 tablet (4 mg total) by mouth every 6 (six) hours.    Dispense:  12 tablet    Refill:  0    Order Specific Question:   Supervising Provider    Answer:   Merrilee Jansky [id number]   Ondansetron prescribed for nausea and vomiting Ibuprofen prescribed Cyclobenzaprine prescribed Continue conservative management of rest, ice, and gentle stretches Take ibuprofen as needed for pain relief (may cause abdominal discomfort, ulcers, and GI bleeds avoid taking with other NSAIDs) Take cyclobenzaprine at nighttime for symptomatic relief. Avoid driving or operating heavy machinery while using medication. Follow up with PCP if symptoms persist Return or go to the ER if you have any new or worsening symptoms (fever, chills, chest pain, abdominal pain, changes in bowel or bladder habits, pain radiating into lower legs)   Reviewed expectations re: course of current medical issues. Questions answered. Outlined signs and symptoms indicating need for more acute intervention. Patient verbalized understanding. After Visit Summary given.       Moshe Cipro, NP 06/29/20 1026
